# Patient Record
Sex: Male | Born: 1980 | Race: Black or African American | Hispanic: No | Marital: Single | Smoking: Current every day smoker
Health system: Southern US, Community
[De-identification: ages and names within clinical notes are randomized; demographics above are authoritative.]

## PROBLEM LIST (undated history)

## (undated) DIAGNOSIS — J45909 Unspecified asthma, uncomplicated: Secondary | ICD-10-CM

---

## 2013-05-16 ENCOUNTER — Emergency Department (HOSPITAL_COMMUNITY)
Admission: EM | Admit: 2013-05-16 | Discharge: 2013-05-16 | Disposition: A | Discharge status: Home / Self Care | Payer: Self-pay | Attending: Emergency Medicine | Admitting: Emergency Medicine

## 2013-05-16 ENCOUNTER — Encounter (HOSPITAL_COMMUNITY): Payer: Self-pay | Admitting: Emergency Medicine

## 2013-05-16 ENCOUNTER — Emergency Department (HOSPITAL_COMMUNITY): Payer: Self-pay

## 2013-05-16 DIAGNOSIS — J45909 Unspecified asthma, uncomplicated: Secondary | ICD-10-CM | POA: Insufficient documentation

## 2013-05-16 DIAGNOSIS — R109 Unspecified abdominal pain: Secondary | ICD-10-CM

## 2013-05-16 DIAGNOSIS — R1032 Left lower quadrant pain: Secondary | ICD-10-CM | POA: Insufficient documentation

## 2013-05-16 HISTORY — DX: Unspecified asthma, uncomplicated: J45.909

## 2013-05-16 LAB — URINALYSIS, ROUTINE W REFLEX MICROSCOPIC
Ketones, ur: NEGATIVE mg/dL
Leukocytes, UA: NEGATIVE
Nitrite: NEGATIVE
Protein, ur: NEGATIVE mg/dL
Urobilinogen, UA: 1 mg/dL (ref 0.0–1.0)

## 2013-05-16 LAB — CBC WITH DIFFERENTIAL/PLATELET
Basophils Absolute: 0 10*3/uL (ref 0.0–0.1)
Basophils Relative: 1 % (ref 0–1)
Eosinophils Relative: 1 % (ref 0–5)
HCT: 43.8 % (ref 39.0–52.0)
MCHC: 34.2 g/dL (ref 30.0–36.0)
MCV: 92.8 fL (ref 78.0–100.0)
Monocytes Absolute: 0.8 10*3/uL (ref 0.1–1.0)
Neutro Abs: 4.4 10*3/uL (ref 1.7–7.7)
Platelets: 327 10*3/uL (ref 150–400)
RDW: 12.4 % (ref 11.5–15.5)
WBC: 7.9 10*3/uL (ref 4.0–10.5)

## 2013-05-16 LAB — BASIC METABOLIC PANEL
Calcium: 9.8 mg/dL (ref 8.4–10.5)
Creatinine, Ser: 0.89 mg/dL (ref 0.50–1.35)
GFR calc Af Amer: >90 mL/min (ref >90)
Sodium: 138 mEq/L (ref 135–145)

## 2013-05-16 MED ORDER — TRAMADOL HCL 50 MG PO TABS: 50.0000 mg | ORAL_TABLET | Freq: Four times a day (QID) | ORAL | Status: AC | PRN

## 2013-05-16 NOTE — ED Provider Notes (Signed)
History     CSN: [id number]  Arrival date & time 05/16/13  1107   First MD Initiated Contact with Patient 05/16/13 1158      Chief Complaint  Patient presents with  . Abdominal Pain    (Consider location/radiation/quality/duration/timing/severity/associated sxs/prior treatment) HPI Comments: Patient presents to the ER for evaluation of abdominal pain. This has been experiencing sharp stabbing pains in the left lower abdomen intermittently for 2 weeks. He has not noticed any specific causes of the pain. There is no associated fever, nausea, vomiting, diarrhea, constipation or any urinary symptoms.  Patient is a 32 y.o. male presenting with abdominal pain.  Abdominal Pain Associated symptoms include abdominal pain.    Past Medical History  Diagnosis Date  . Asthma     History reviewed. No pertinent past surgical history.  History reviewed. No pertinent family history.  History  Substance Use Topics  . Smoking status: Never Smoker   . Smokeless tobacco: Not on file  . Alcohol Use: Yes     Comment: occasionally      Review of Systems  Gastrointestinal: Positive for abdominal pain.  All other systems reviewed and are negative.    Allergies  Review of patient's allergies indicates no known allergies.  Home Medications  No current outpatient prescriptions on file.  BP 128/68  Pulse 54  Temp(Src) 98.5 F (36.9 C) (Oral)  Resp 16  Ht 5\' 10"  (1.778 m)  Wt 180 lb (81.647 kg)  BMI 25.83 kg/m2  SpO2 100%  Physical Exam  Constitutional: He is oriented to person, place, and time. He appears well-developed and well-nourished. No distress.  HENT:  Head: Normocephalic and atraumatic.  Right Ear: Hearing normal.  Left Ear: Hearing normal.  Nose: Nose normal.  Mouth/Throat: Oropharynx is clear and moist and mucous membranes are normal.  Eyes: Conjunctivae and EOM are normal. Pupils are equal, round, and reactive to light.  Neck: Normal range of motion. Neck supple.   Cardiovascular: Regular rhythm, S1 normal and S2 normal.  Exam reveals no gallop and no friction rub.   No murmur heard. Pulmonary/Chest: Effort normal and breath sounds normal. No respiratory distress. He exhibits no tenderness.  Abdominal: Soft. Normal appearance and bowel sounds are normal. There is no hepatosplenomegaly. There is no tenderness. There is no rebound, no guarding, no tenderness at McBurney's point and negative Murphy's sign. No hernia.  Musculoskeletal: Normal range of motion.  Neurological: He is alert and oriented to person, place, and time. He has normal strength. No cranial nerve deficit or sensory deficit. Coordination normal. GCS eye subscore is 4. GCS verbal subscore is 5. GCS motor subscore is 6.  Skin: Skin is warm, dry and intact. No rash noted. No cyanosis.  Psychiatric: He has a normal mood and affect. His speech is normal and behavior is normal. Thought content normal.    ED Course  Procedures (including critical care time)  Labs Reviewed  BASIC METABOLIC PANEL - Abnormal; Notable for the following:    BUN 5 (*)    All other components within normal limits  CBC WITH DIFFERENTIAL  URINALYSIS, ROUTINE W REFLEX MICROSCOPIC   Dg Abd Acute W/chest  05/16/2013   *RADIOLOGY REPORT*  Clinical Data: Smoker with abdominal pain for the past 2 weeks.  ACUTE ABDOMEN SERIES (ABDOMEN 2 VIEW & CHEST 1 VIEW)  Comparison: None.  Findings: Normal sized heart.  Clear lungs.  The lungs are mildly hyperexpanded with minimally prominent interstitial markings. Normal bowel gas pattern without free peritoneal air.  4 mm oval calcification in the inferior left pelvis.  Normal appearing bones.  IMPRESSION:  1.  4 mm probable left pelvic phlebolith.  A distal ureteral calculus is less likely. 2.  Mild changes of COPD.   Original Report Authenticated By: Beckie Salts, M.D.     Diagnosis: Abdominal Pain    MDM  Patient presents to the ER with complaints of pain in the left lower  abdomen area that has been going on for 2 weeks. Pain comes and goes. He has no associated symptoms. He is not current presently and his abdominal exam is benign, nontender. Patient's blood work was unremarkable. Urinalysis is entirely normal. X-ray does not show any significant constipation or obstruction. Normal bowel gas. Patient does have calcification in the left lower abdomen area that is felt to be a phlebolith. Patient is to have a hematuria on urinalysis and ureterolithiasis is felt to be unlikely, as the patient's symptoms have been present intermittently for 2 weeks.  Patient does report that he has been working out lately. He has some increased pain with bending, twisting or movement. This might be secondary to abdominal wall strain. There does not appear to be any acute surgical process present. Patient will be discharged, rest and analgesia. Return if symptoms worsen.       Gilda Crease, MD 05/16/13 [phone]

## 2013-05-16 NOTE — ED Notes (Signed)
Pt states he has been having lower left  abdominal pain x2 weeks denies nausea or vomiting.

## 2013-07-22 ENCOUNTER — Emergency Department: Payer: Self-pay | Admitting: Emergency Medicine

## 2013-07-22 LAB — BASIC METABOLIC PANEL
Chloride: 103 mmol/L (ref 98–107)
Creatinine: 1.22 mg/dL (ref 0.60–1.30)
EGFR (African American): >60
EGFR (Non-African Amer.): >60
Glucose: 111 mg/dL — ABNORMAL HIGH (ref 65–99)
Osmolality: 270 (ref 275–301)
Potassium: 3.3 mmol/L — ABNORMAL LOW (ref 3.5–5.1)
Sodium: 136 mmol/L (ref 136–145)

## 2013-07-22 LAB — CBC
HCT: 43.1 % (ref 40.0–52.0)
MCHC: 33.8 g/dL (ref 32.0–36.0)
MCV: 95 fL (ref 80–100)
WBC: 12.2 10*3/uL — ABNORMAL HIGH (ref 3.8–10.6)

## 2013-07-22 LAB — URINALYSIS, COMPLETE
Bilirubin,UR: NEGATIVE
Glucose,UR: NEGATIVE mg/dL (ref 0–75)
Ketone: NEGATIVE
Leukocyte Esterase: NEGATIVE
Ph: 5 (ref 4.5–8.0)
Protein: NEGATIVE
RBC,UR: <1 /HPF (ref 0–5)
Specific Gravity: 1.005 (ref 1.003–1.030)
WBC UR: 1 /HPF (ref 0–5)

## 2013-07-22 LAB — DRUG SCREEN, URINE
Cannabinoid 50 Ng, Ur ~~LOC~~: POSITIVE (ref ?–50)
MDMA (Ecstasy)Ur Screen: NEGATIVE (ref ?–500)
Opiate, Ur Screen: NEGATIVE (ref ?–300)
Phencyclidine (PCP) Ur S: NEGATIVE (ref ?–25)

## 2013-12-28 ENCOUNTER — Ambulatory Visit: Payer: Self-pay

## 2013-12-28 LAB — URINALYSIS, COMPLETE
BLOOD: NEGATIVE
Bilirubin,UR: NEGATIVE
GLUCOSE, UR: NEGATIVE mg/dL (ref 0–75)
Ketone: NEGATIVE
NITRITE: NEGATIVE
PH: 8 (ref 4.5–8.0)
Protein: NEGATIVE
Specific Gravity: 1.005 (ref 1.003–1.030)
Squamous Epithelial: NONE SEEN
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
## 2015-04-05 ENCOUNTER — Ambulatory Visit
Admission: EM | Admit: 2015-04-05 | Discharge: 2015-04-05 | Disposition: A | Discharge status: Home / Self Care | Payer: Self-pay | Attending: Family Medicine | Admitting: Family Medicine

## 2015-04-05 DIAGNOSIS — R109 Unspecified abdominal pain: Secondary | ICD-10-CM | POA: Insufficient documentation

## 2015-04-05 DIAGNOSIS — J45909 Unspecified asthma, uncomplicated: Secondary | ICD-10-CM | POA: Insufficient documentation

## 2015-04-05 DIAGNOSIS — R1030 Lower abdominal pain, unspecified: Secondary | ICD-10-CM | POA: Diagnosis present

## 2015-04-05 DIAGNOSIS — R369 Urethral discharge, unspecified: Secondary | ICD-10-CM | POA: Diagnosis present

## 2015-04-05 DIAGNOSIS — F17210 Nicotine dependence, cigarettes, uncomplicated: Secondary | ICD-10-CM | POA: Insufficient documentation

## 2015-04-05 LAB — URINALYSIS COMPLETE WITH MICROSCOPIC (ARMC ONLY)
Bilirubin Urine: NEGATIVE
GLUCOSE, UA: NEGATIVE mg/dL
HGB URINE DIPSTICK: NEGATIVE
Ketones, ur: NEGATIVE mg/dL
NITRITE: NEGATIVE
Protein, ur: NEGATIVE mg/dL
RBC / HPF: NONE SEEN RBC/hpf (ref <3)
Specific Gravity, Urine: 1.02 (ref 1.005–1.030)
pH: 6 (ref 5.0–8.0)

## 2015-04-05 LAB — CHLAMYDIA/NGC RT PCR (ARMC ONLY)
Chlamydia Tr: NOT DETECTED
N gonorrhoeae: NOT DETECTED

## 2015-04-05 MED ORDER — AZITHROMYCIN 500 MG PO TABS
1000.0000 mg | ORAL_TABLET | Freq: Once | ORAL | Status: AC
  Administered 2015-04-05: 1000 mg via ORAL

## 2015-04-05 MED ORDER — CEFTRIAXONE SODIUM 250 MG IJ SOLR
250.0000 mg | Freq: Once | INTRAMUSCULAR | Status: AC
  Administered 2015-04-05: 250 mg via INTRAMUSCULAR

## 2015-04-05 NOTE — ED Provider Notes (Signed)
CSN: [id number]     Arrival date & time 04/05/15  1209 History   First MD Initiated Contact with Patient 04/05/15 1308     Chief Complaint  Patient presents with  . Abdominal Pain   Patient is a 34 y.o. male presenting with abdominal pain and penile discharge. The history is provided by the patient.  Abdominal Pain Pain location:  Suprapubic, LLQ and RLQ (patients states this has been associated with some intemittent penile discharge; states sexually active with different partness but uses condoms; states has a history of chlamydia  about 1 year ago) Pain quality: aching   Pain radiates to:  Does not radiate Pain severity:  Mild Timing:  Intermittent Chronicity:  New Context: recent sexual activity   Context: not alcohol use, not awakening from sleep, not diet changes, not eating, not laxative use, not medication withdrawal, not previous surgeries, not recent illness, not recent travel, not retching, not sick contacts, not suspicious food intake and not trauma   Relieved by:  Nothing Associated symptoms: no anorexia, no belching, no chest pain, no chills, no constipation, no cough, no diarrhea, no dysuria, no fatigue, no fever, no flatus, no hematemesis, no hematochezia, no hematuria, no melena, no nausea, no shortness of breath, no sore throat and no vomiting   Penile Discharge This is a new problem. The current episode started more than 2 days ago. Episode frequency: intermittently. The problem has not changed since onset.Associated symptoms include abdominal pain. Pertinent negatives include no chest pain and no shortness of breath.    Past Medical History  Diagnosis Date  . Asthma    History reviewed. No pertinent past surgical history. Family History  Problem Relation Age of Onset  . Heart attack Father    History  Substance Use Topics  . Smoking status: Current Every Day Smoker  . Smokeless tobacco: Not on file  . Alcohol Use: No    Review of Systems  Constitutional:  Negative for fever, chills and fatigue.  HENT: Negative for sore throat.   Respiratory: Negative for cough and shortness of breath.   Cardiovascular: Negative for chest pain.  Gastrointestinal: Positive for abdominal pain. Negative for nausea, vomiting, diarrhea, constipation, melena, hematochezia, anorexia, flatus and hematemesis.  Genitourinary: Positive for discharge. Negative for dysuria and hematuria.    Allergies  Review of patient's allergies indicates no known allergies.  Home Medications   Prior to Admission medications   Medication Sig Start Date End Date Taking? Authorizing Provider  traMADol (ULTRAM) 50 MG tablet Take 1 tablet (50 mg total) by mouth every 6 (six) hours as needed for pain. 05/16/13   Gilda Creasehristopher J Pollina, MD   BP 124/71 mmHg  Pulse 62  Temp(Src) 97.2 F (36.2 C) (Tympanic)  Resp 18  Ht 5\' 10"  (1.778 m)  Wt 190 lb (86.183 kg)  BMI 27.26 kg/m2  SpO2 99% Physical Exam  Constitutional: He appears well-developed and well-nourished. No distress.  Abdominal: Soft. Bowel sounds are normal. He exhibits no distension and no mass. There is no tenderness. There is no rebound and no guarding. Hernia confirmed negative in the right inguinal area and confirmed negative in the left inguinal area.  Genitourinary: Testes normal and penis normal. Cremasteric reflex is present. No phimosis, paraphimosis, penile erythema or penile tenderness. No discharge found.  Lymphadenopathy:       Right: No inguinal adenopathy present.       Left: No inguinal adenopathy present.  Skin: He is not diaphoretic.  Nursing note and vitals  reviewed.   ED Course  Procedures (including critical care time) Labs Review Labs Reviewed  URINALYSIS COMPLETEWITH MICROSCOPIC [facility])  - Abnormal; Notable for the following:    Leukocytes, UA TRACE (*)    Squamous Epithelial / LPF 0-5 (*)    All other components within normal limits  CHLAMYDIA/NGC RT PCR [facility])   URINE CULTURE     Imaging Review No results found.   MDM   1. Penile Discharge 2. Abdominal pain  Plan: 1. Test/x-ray results and diagnosis reviewed with patient 2. rx as per orders; risks, benefits, potential side effects reviewed with patient; patient given empiric treatment with 250mg  Rocephin IM x1 and zithromax 1gm po 3. Patient will be notified of test results 4. F/u prn if symptoms worsen or don't improve    Payton Mccallumrlando Somaya Grassi, MD 04/05/15 1419

## 2015-04-05 NOTE — ED Notes (Signed)
Started 4 days ago with low right and left abdominal pain. States urine has + odor. Denies N/V. Normal BM's

## 2015-04-05 NOTE — Discharge Instructions (Signed)
Abdominal Pain °Many things can cause abdominal pain. Usually, abdominal pain is not caused by a disease and will improve without treatment. It can often be observed and treated at home. Your health care provider will do a physical exam and possibly order blood tests and X-rays to help determine the seriousness of your pain. However, in many cases, more time must pass before a clear cause of the pain can be found. Before that point, your health care provider Andrew Shea not know if you need more testing or further treatment. °HOME CARE INSTRUCTIONS  °Monitor your abdominal pain for any changes. The following actions Lalone help to alleviate any discomfort you are experiencing: °· Only take over-the-counter or prescription medicines as directed by your health care provider. °· Do not take laxatives unless directed to do so by your health care provider. °· Try a clear liquid diet (broth, tea, or water) as directed by your health care provider. Slowly move to a bland diet as tolerated. °SEEK MEDICAL CARE IF: °· You have unexplained abdominal pain. °· You have abdominal pain associated with nausea or diarrhea. °· You have pain when you urinate or have a bowel movement. °· You experience abdominal pain that wakes you in the night. °· You have abdominal pain that is worsened or improved by eating food. °· You have abdominal pain that is worsened with eating fatty foods. °· You have a fever. °SEEK IMMEDIATE MEDICAL CARE IF:  °· Your pain does not go away within 2 hours. °· You keep throwing up (vomiting). °· Your pain is felt only in portions of the abdomen, such as the right side or the left lower portion of the abdomen. °· You pass bloody or black tarry stools. °MAKE SURE YOU: °· Understand these instructions.   °· Will watch your condition.   °· Will get help right away if you are not doing well or get worse.   °Document Released: 08/26/2005 Document Revised: 11/21/2013 Document Reviewed: 07/26/2013 °[employer]® Patient Information  ©2015 [employer]. This information is not intended to replace advice given to you by your health care provider. Make sure you discuss any questions you have with your health care provider. ° °

## 2015-04-06 ENCOUNTER — Telehealth: Payer: Self-pay

## 2015-04-06 NOTE — ED Notes (Signed)
Spoke with pt. Pt. Advised that GC/Chl test was negative. Patient verbalized understanding and will call back with any questions or concerns.

## 2015-04-07 LAB — URINE CULTURE
CULTURE: NO GROWTH
SPECIAL REQUESTS: NORMAL
